# Patient Record
Sex: Male | Born: 1975 | Race: Black or African American | Hispanic: No | Marital: Single | Smoking: Never smoker
Health system: Southern US, Community
[De-identification: ages and names within clinical notes are randomized; demographics above are authoritative.]

## PROBLEM LIST (undated history)

## (undated) HISTORY — PX: ARM DEBRIDEMENT: SHX890

---

## 2019-04-29 ENCOUNTER — Emergency Department (HOSPITAL_COMMUNITY)

## 2019-04-29 ENCOUNTER — Emergency Department (HOSPITAL_COMMUNITY)
Admission: EM | Admit: 2019-04-29 | Discharge: 2019-04-29 | Disposition: A | Discharge status: Home / Self Care | Attending: Emergency Medicine | Admitting: Emergency Medicine

## 2019-04-29 ENCOUNTER — Encounter (HOSPITAL_COMMUNITY): Payer: Self-pay | Admitting: *Deleted

## 2019-04-29 ENCOUNTER — Other Ambulatory Visit: Payer: Self-pay

## 2019-04-29 DIAGNOSIS — X500XXA Overexertion from strenuous movement or load, initial encounter: Secondary | ICD-10-CM | POA: Diagnosis not present

## 2019-04-29 DIAGNOSIS — Y999 Unspecified external cause status: Secondary | ICD-10-CM | POA: Diagnosis not present

## 2019-04-29 DIAGNOSIS — Y9389 Activity, other specified: Secondary | ICD-10-CM | POA: Diagnosis not present

## 2019-04-29 DIAGNOSIS — Y929 Unspecified place or not applicable: Secondary | ICD-10-CM | POA: Diagnosis not present

## 2019-04-29 DIAGNOSIS — S4992XA Unspecified injury of left shoulder and upper arm, initial encounter: Secondary | ICD-10-CM | POA: Diagnosis present

## 2019-04-29 DIAGNOSIS — S46912A Strain of unspecified muscle, fascia and tendon at shoulder and upper arm level, left arm, initial encounter: Secondary | ICD-10-CM | POA: Diagnosis not present

## 2019-04-29 MED ORDER — IBUPROFEN 400 MG PO TABS
400.0000 mg | ORAL_TABLET | Freq: Once | ORAL | Status: AC
  Administered 2019-04-29: 400 mg via ORAL
  Filled 2019-04-29: qty 1

## 2019-04-29 MED ORDER — METHOCARBAMOL 750 MG PO TABS: 750.0000 mg | ORAL_TABLET | Freq: Three times a day (TID) | ORAL | 0 refills | Status: AC | PRN

## 2019-04-29 MED ORDER — IBUPROFEN 600 MG PO TABS: 600.0000 mg | ORAL_TABLET | Freq: Three times a day (TID) | ORAL | 0 refills | Status: AC | PRN

## 2019-04-29 MED ORDER — METHOCARBAMOL 500 MG PO TABS
750.0000 mg | ORAL_TABLET | Freq: Once | ORAL | Status: AC
  Administered 2019-04-29: 750 mg via ORAL
  Filled 2019-04-29: qty 2

## 2019-04-29 NOTE — ED Triage Notes (Signed)
Pt c/o left shoulder pain after moving a bed on Wednesday. Pt reports he cannot lift his arm or move it side to side without pain. Pt has home made sling on left shoulder and has taken Aspirin PTA. Pt is an inmate and has guard with him in triage.

## 2019-04-29 NOTE — ED Provider Notes (Signed)
[facility] EMERGENCY DEPARTMENT Provider Note   CSN: [id number] Arrival date & time: 04/29/19  1025     History Chief Complaint  Patient presents with  . Shoulder Injury    Gregory Romero is a 44 y.o. male.  Patient c/o left shoulder pain. Symptoms acute onset 2 days ago, moderate, dull, constant, worse w active rom left shoulder, non radiating. States the day before had done some heavy lifting, moving bed/frame. Denies arm numbness/weakness. No neck pain or radicular pain. No hx chronic shoulder problems. Is right hand dominant. Denies arm swelling. No redness. No skin lesions.   The history is provided by the patient.  Shoulder Injury Pertinent negatives include no chest pain and no headaches.       History reviewed. No pertinent past medical history.  There are no problems to display for this patient.   Past Surgical History:  Procedure Laterality Date  . ARM DEBRIDEMENT Left    due to MRSA infection       No family history on file.  Social History   Tobacco Use  . Smoking status: Never Smoker  . Smokeless tobacco: Never Used  Substance Use Topics  . Alcohol use: Never  . Drug use: Never    Home Medications Prior to Admission medications   Not on File    Allergies    Patient has no known allergies.  Review of Systems   Review of Systems  Constitutional: Negative for fever.  Cardiovascular: Negative for chest pain.  Musculoskeletal: Negative for neck pain and neck stiffness.       Left shoulder pain.   Skin: Negative for rash and wound.  Neurological: Negative for weakness, numbness and headaches.    Physical Exam Updated Vital Signs BP (!) 141/85 (BP Location: Right Arm)   Pulse 85   Temp 98.4 F (36.9 C)   Resp 18   Ht 1.88 m (6\' 2" )   Wt 92.5 kg   SpO2 100%   BMI 26.19 kg/m   Physical Exam Vitals and nursing note reviewed.  Constitutional:      Appearance: Normal appearance. He is well-developed.  HENT:     Head: Atraumatic.   Nose: Nose normal.     Mouth/Throat:     Mouth: Mucous membranes are moist.  Eyes:     General: No scleral icterus.    Conjunctiva/sclera: Conjunctivae normal.  Neck:     Trachea: No tracheal deviation.  Cardiovascular:     Rate and Rhythm: Normal rate.     Pulses: Normal pulses.  Pulmonary:     Effort: Pulmonary effort is normal. No accessory muscle usage or respiratory distress.  Abdominal:     General: There is no distension.  Genitourinary:    Comments: No cva tenderness. Musculoskeletal:        General: No swelling.     Cervical back: Normal range of motion and neck supple. No rigidity.     Comments: Marked tenderness left shoulder reproducing symptoms. No sts noted. No skin changes or erythema. Radial pulse 2+. Pain w active rom left shoulder, limiting rom. w slow passive rom shoulder, no pain. No other focal bony tenderness on LUE exam. C spine non tender, aligned.   Skin:    General: Skin is warm and dry.     Findings: No rash.  Neurological:     Mental Status: He is alert.     Comments: Alert, speech clear. LUE motor, rad/med/uln n intact, stre 5/5. Sensation, rad/med/uln intact.   Psychiatric:  Mood and Affect: Mood normal.     ED Results / Procedures / Treatments   Labs (all labs ordered are listed, but only abnormal results are displayed) Labs Reviewed - No data to display  EKG None  Radiology DG Shoulder Left  Result Date: 04/29/2019 CLINICAL DATA:  Pop in the left shoulder when the patient lifted a bed. Pain. Initial encounter. EXAM: LEFT SHOULDER - 2+ VIEW COMPARISON:  None. FINDINGS: There is no acute bony or joint abnormality. Moderate acromioclavicular osteoarthritis is noted. The glenohumeral joint appears normal. Imaged left lung and ribs are normal appearance. Soft tissues appear normal. IMPRESSION: No acute abnormality. Moderate acromioclavicular osteoarthritis. Electronically Signed   By: Inge Rise M.D.   On: 04/29/2019 10:37     Procedures Procedures (including critical care time)  Medications Ordered in ED Medications - No data to display  ED Course  I have reviewed the triage vital signs and the nursing notes.  Pertinent labs & imaging results that were available during my care of the patient were reviewed by me and considered in my medical decision making (see chart for details).    MDM Rules/Calculators/A&P                      Xrays.  Patient has ride, does not have to drive. ibuprofen po.   Reviewed nursing notes and prior charts for additional history.   Xrays reviewed/interpreted by me - no fx/disloc. +arthritic changes.   Discussed diff dx, including shoulder strain/pain, rotator cuff strain/injury, AC strain/injury, tendonitis, etc.   Will try nsaid, robaxin for symptom relief at home.   PCP/ortho f/u as outpt.   Return precautions provided.    Final Clinical Impression(s) / ED Diagnoses Final diagnoses:  None    Rx / DC Orders ED Discharge Orders    None       Lajean Saver, MD 04/29/19 1048

## 2019-04-29 NOTE — Discharge Instructions (Addendum)
It was our pleasure to provide your ER care today - we hope that you feel better.  Cold/icepack to sore area. You may use sling for comfort/support for the next few days.   Take ibuprofen as need for pain. You may also try robaxin as need for muscle pain/spasm - no driving when taking.   Follow up with primary care doctor or orthopedist in 1-2 weeks if symptoms fail to improve/resolve.  Return to ER if worse, new symptoms, fevers, intractable pain, numbness/weakness, or other concern.

## 2019-04-29 NOTE — ED Notes (Signed)
Returned from xray

## 2019-04-29 NOTE — ED Notes (Signed)
Patient transported to X-ray 

## 2021-01-10 IMAGING — DX DG SHOULDER 2+V*L*
3 series · 3 of 3 positions shown · non-contrast
Comparison: None.

CLINICAL DATA: Pop in the left shoulder when the patient lifted a
bed. Pain. Initial encounter.

EXAM:
LEFT SHOULDER - 2+ VIEW

[shoulder grashey]
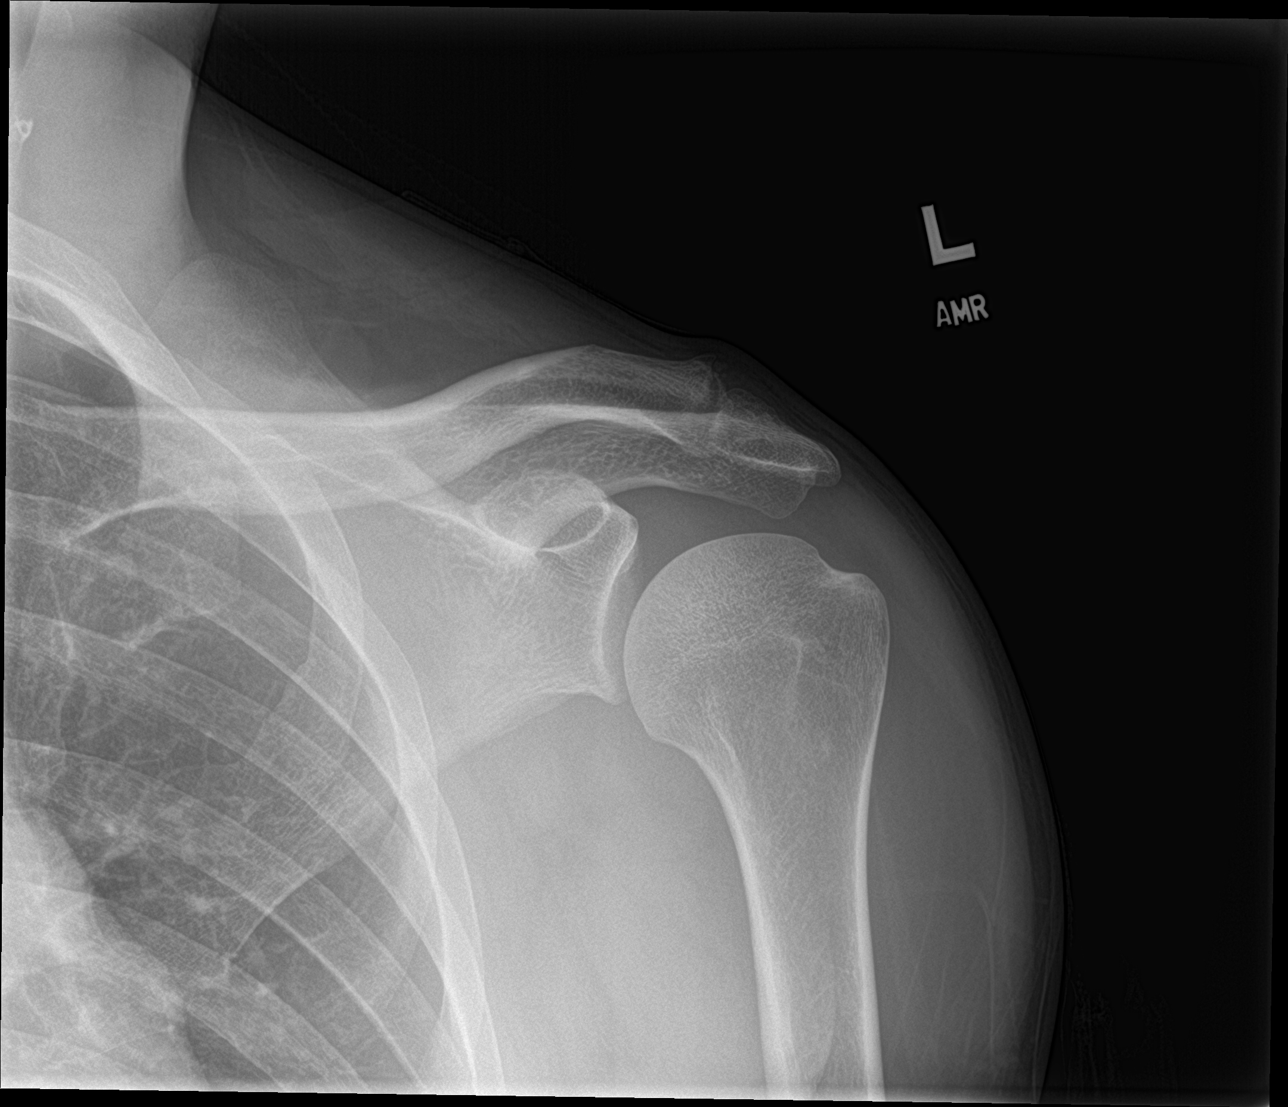

[shoulder y view]
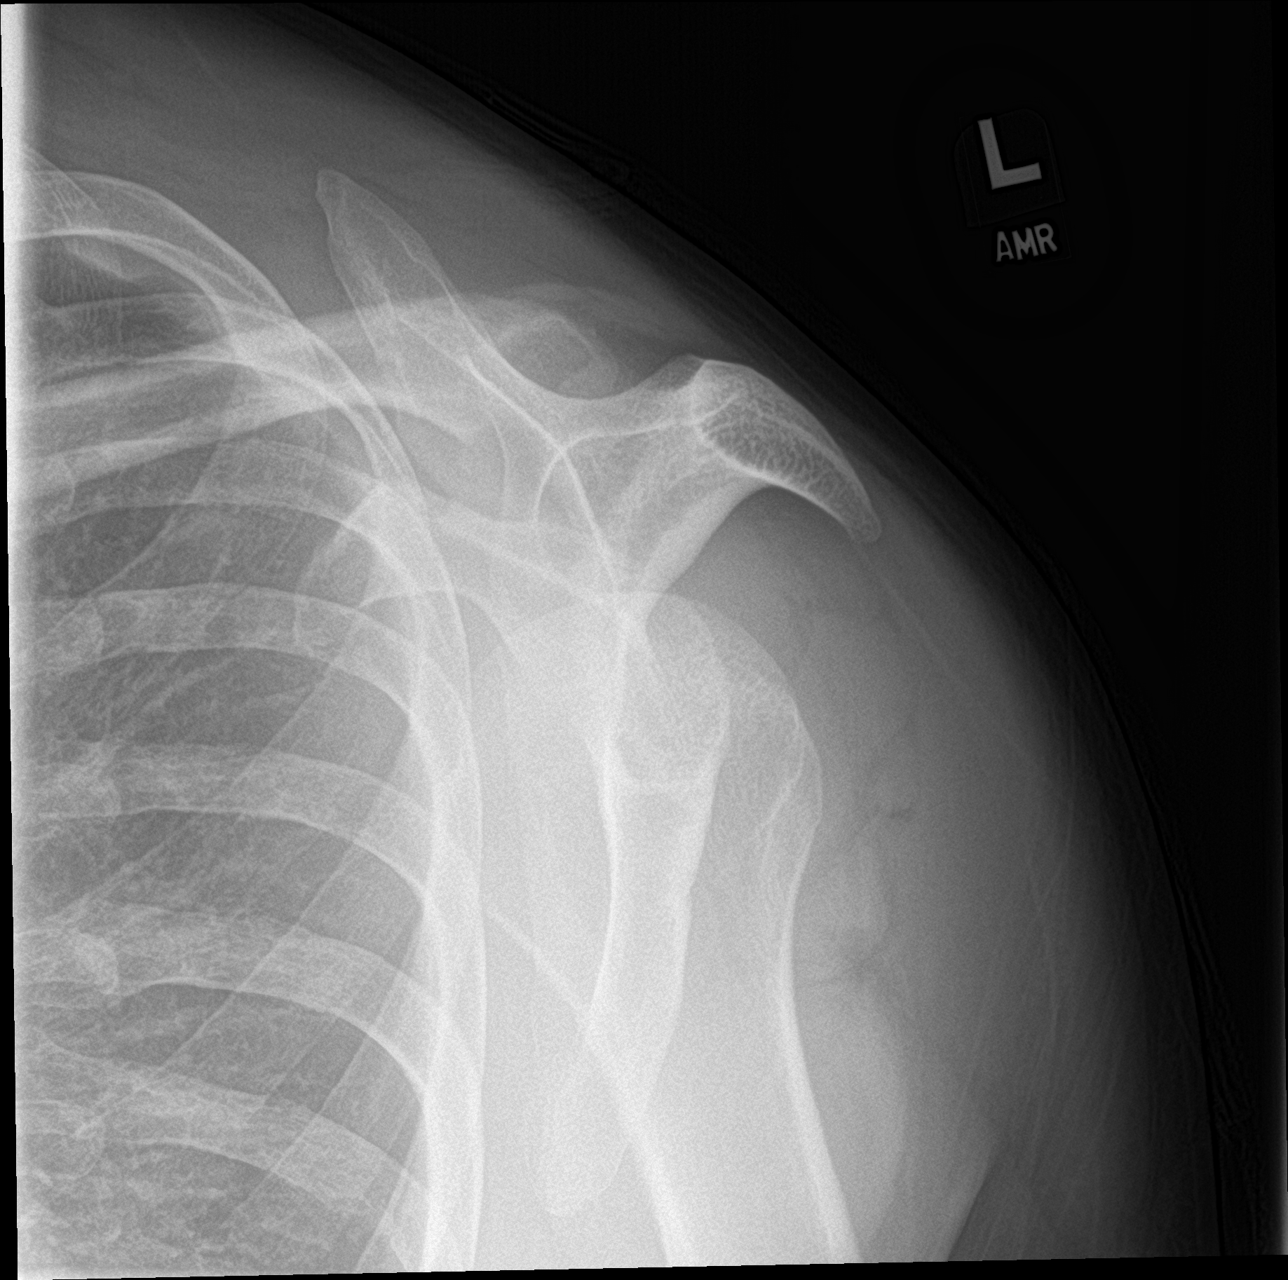

[shoulder axillary]
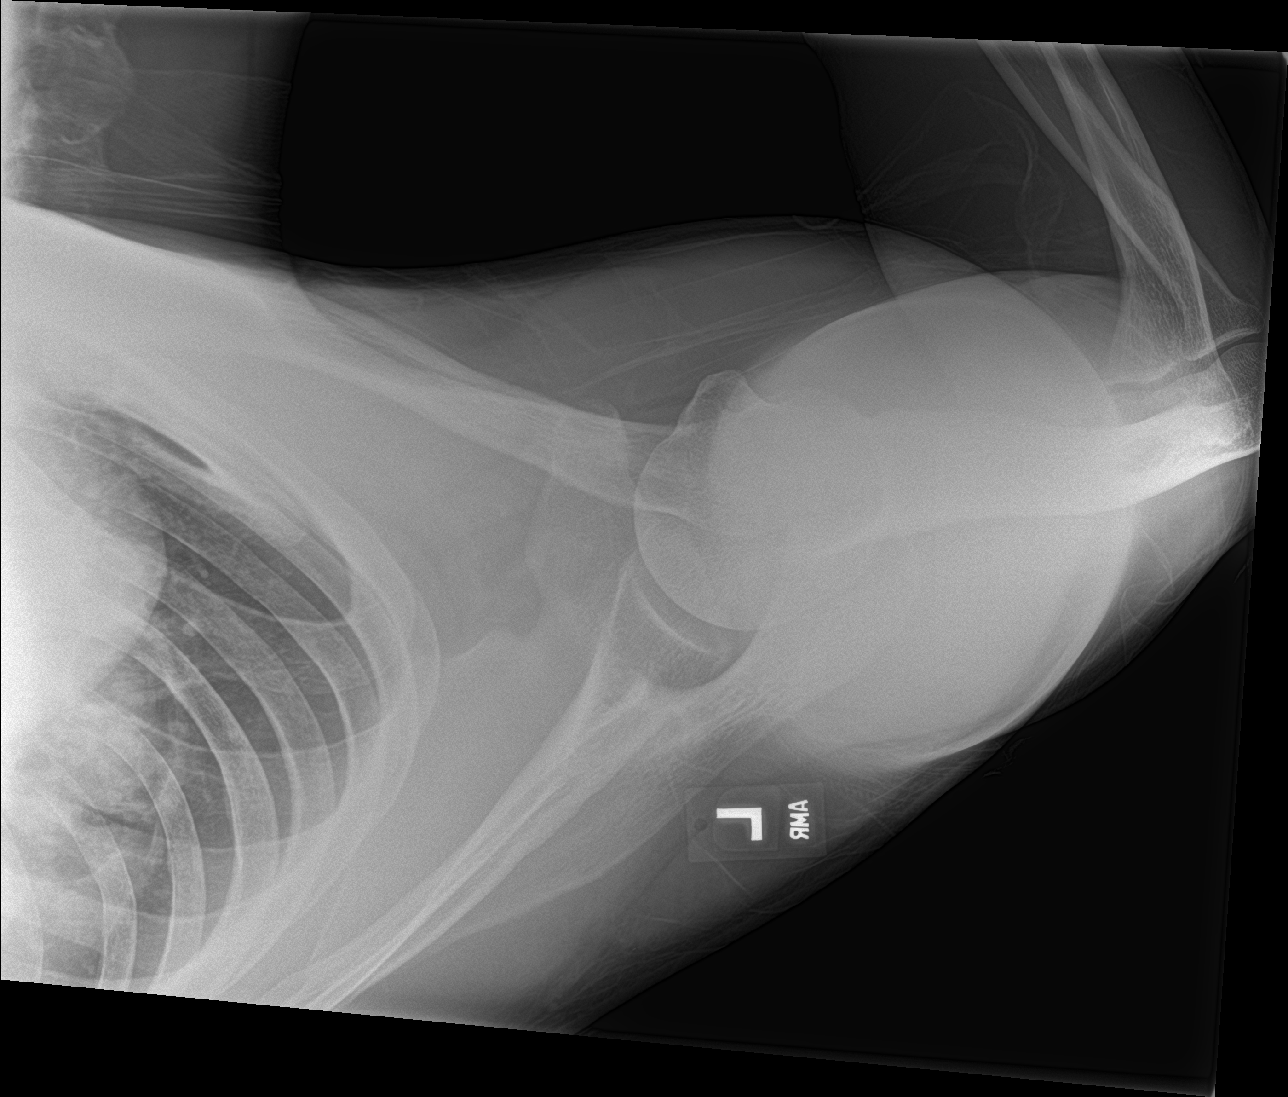

[3 of 3 positions shown; findings below may reference images not displayed]

FINDINGS: There is no acute bony or joint abnormality. Moderate
acromioclavicular osteoarthritis is noted. The glenohumeral joint
appears normal. Imaged left lung and ribs are normal appearance.
Soft tissues appear normal.
IMPRESSION: No acute abnormality.

Moderate acromioclavicular osteoarthritis.
